# Patient Record
Sex: Female | Born: 1960 | Race: White | Hispanic: No | Marital: Married | Smoking: Current every day smoker
Health system: Southern US, Community
[De-identification: ages and names within clinical notes are randomized; demographics above are authoritative.]

---

## 2016-01-23 DIAGNOSIS — Y990 Civilian activity done for income or pay: Secondary | ICD-10-CM | POA: Diagnosis not present

## 2016-01-23 DIAGNOSIS — F17200 Nicotine dependence, unspecified, uncomplicated: Secondary | ICD-10-CM | POA: Insufficient documentation

## 2016-01-23 DIAGNOSIS — S63287A Dislocation of proximal interphalangeal joint of left little finger, initial encounter: Secondary | ICD-10-CM | POA: Diagnosis not present

## 2016-01-23 DIAGNOSIS — Y929 Unspecified place or not applicable: Secondary | ICD-10-CM | POA: Insufficient documentation

## 2016-01-23 DIAGNOSIS — W010XXA Fall on same level from slipping, tripping and stumbling without subsequent striking against object, initial encounter: Secondary | ICD-10-CM | POA: Insufficient documentation

## 2016-01-23 DIAGNOSIS — S6992XA Unspecified injury of left wrist, hand and finger(s), initial encounter: Secondary | ICD-10-CM | POA: Diagnosis present

## 2016-01-23 DIAGNOSIS — Y939 Activity, unspecified: Secondary | ICD-10-CM | POA: Diagnosis not present

## 2016-01-23 DIAGNOSIS — S62661A Nondisplaced fracture of distal phalanx of left index finger, initial encounter for closed fracture: Secondary | ICD-10-CM | POA: Insufficient documentation

## 2016-01-24 ENCOUNTER — Encounter (HOSPITAL_COMMUNITY): Payer: Self-pay | Admitting: Emergency Medicine

## 2016-01-24 ENCOUNTER — Emergency Department (HOSPITAL_COMMUNITY): Payer: No Typology Code available for payment source

## 2016-01-24 ENCOUNTER — Emergency Department (HOSPITAL_COMMUNITY)
Admission: EM | Admit: 2016-01-24 | Discharge: 2016-01-24 | Disposition: A | Discharge status: Home / Self Care | Payer: No Typology Code available for payment source | Attending: Emergency Medicine | Admitting: Emergency Medicine

## 2016-01-24 DIAGNOSIS — S63259A Unspecified dislocation of unspecified finger, initial encounter: Secondary | ICD-10-CM

## 2016-01-24 DIAGNOSIS — S62609A Fracture of unspecified phalanx of unspecified finger, initial encounter for closed fracture: Secondary | ICD-10-CM

## 2016-01-24 MED ORDER — BUPIVACAINE HCL 0.5 % IJ SOLN
50.0000 mL | Freq: Once | INTRAMUSCULAR | Status: DC
  Filled 2016-01-24: qty 50

## 2016-01-24 MED ORDER — HYDROCODONE-ACETAMINOPHEN 5-325 MG PO TABS
1.0000 | ORAL_TABLET | Freq: Once | ORAL | Status: AC
  Administered 2016-01-24: 1 via ORAL
  Filled 2016-01-24: qty 1

## 2016-01-24 MED ORDER — HYDROCODONE-ACETAMINOPHEN 5-325 MG PO TABS: 1.0000 | ORAL_TABLET | Freq: Four times a day (QID) | ORAL | 0 refills | Status: AC | PRN

## 2016-01-24 NOTE — ED Provider Notes (Signed)
[facility]-EMERGENCY DEPT Provider Note   CSN: [id number] Arrival date & time: 01/23/16  2353     History   Chief Complaint Chief Complaint  Patient presents with  . Fall    [employer]  . Hand Injury    HPI Barbara Carr is a 55 y.o. female.  The history is provided by the patient. No language interpreter was used.  Fall  This is a new problem. The current episode started less than 1 hour ago.  Hand Injury   The incident occurred less than 1 hour ago. The incident occurred at work. The injury mechanism was a fall. The pain is present in the left fingers. The quality of the pain is described as throbbing. The pain is moderate. The pain has been constant since the incident. She reports no foreign bodies present. The symptoms are aggravated by palpation and movement.    History reviewed. No pertinent past medical history.  There are no active problems to display for this patient.   History reviewed. No pertinent surgical history.  OB History    No data available       Home Medications    Prior to Admission medications   Not on File    Family History No family history on file.  Social History Social History  Substance Use Topics  . Smoking status: Current Every Day Smoker  . Smokeless tobacco: Never Used  . Alcohol use No     Allergies   Novocain [procaine]   Review of Systems Review of Systems  Musculoskeletal: Positive for arthralgias.  All other systems reviewed and are negative.    Physical Exam Updated Vital Signs BP 106/69 (BP Location: Left Arm)   Pulse 77   Temp 97.9 F (36.6 C) (Oral)   Resp 18   Ht 5' (1.524 m)   Wt 102.1 kg   SpO2 99%   BMI 43.94 kg/m   Physical Exam  Constitutional: She is oriented to person, place, and time. She appears well-developed and well-nourished.  HENT:  Head: Normocephalic.  Eyes: EOM are normal.  Neck: Neck supple.  Cardiovascular: Normal rate and regular rhythm.   Pulmonary/Chest: Effort  normal and breath sounds normal.  Abdominal: Soft. Bowel sounds are normal.  Musculoskeletal: She exhibits edema, tenderness and deformity.       Left hand: She exhibits decreased range of motion, tenderness, deformity and swelling.       Hands: Neurological: She is alert and oriented to person, place, and time.  Skin: Skin is warm and dry. Capillary refill takes less than 2 seconds.  Psychiatric: She has a normal mood and affect.  Nursing note and vitals reviewed.    ED Treatments / Results  Labs (all labs ordered are listed, but only abnormal results are displayed) Labs Reviewed - No data to display  EKG  EKG Interpretation None       Radiology Dg Hand Complete Left  Result Date: 01/24/2016 CLINICAL DATA:  55 year old female with fall and pain over the fourth and fifth digits. EXAM: LEFT HAND - COMPLETE 3+ VIEW COMPARISON:  None FINDINGS: There is a nondisplaced fracture of the base and proximal aspect of the proximal phalanx of the fourth digit. There is no extension of the fracture into the ulnar corner of the articular surface of the base of the phalanx. There is no widening of the joint space for dislocation at the fourth MCP joint. No other fracture identified. There is dorsal dislocation of the fifth digit at the PIP  joint. No associated fracture identified at this site. The bones are mildly osteopenic. There is soft tissue swelling of the proximal aspect of the third and fourth digits. No radiopaque foreign object. IMPRESSION: Nondisplaced fracture of the base and proximal portion of the proximal phalanx of the fourth digit. Dislocation of the fifth digit at the PIP without associated fracture. Electronically Signed   By: Elgie CollardArash  Radparvar M.D.   On: 01/24/2016 01:29   Dg Finger Little Left  Result Date: 01/24/2016 CLINICAL DATA:  55 y/o  F; post reduction radiographs. EXAM: LEFT LITTLE FINGER 2+V COMPARISON:  None. FINDINGS: Interval reduction of the fifth proximal  interphalangeal joint with anatomic alignment. Unchanged nondisplaced fracture through base of fourth proximal phalanx. No new fracture or dislocation identified. IMPRESSION: Interval reduction of the fifth proximal interphalangeal joint with anatomic alignment. Unchanged nondisplaced fracture through base of fourth proximal phalanx. No new fracture or dislocation identified. Electronically Signed   By: Mitzi HansenLance  Furusawa-Stratton M.D.   On: 01/24/2016 02:05    Procedures Procedures (including critical care time)  Medications Ordered in ED Medications  bupivacaine (MARCAINE) 0.5 % (with pres) injection 50 mL (not administered)     Initial Impression / Assessment and Plan / ED Course  I have reviewed the triage vital signs and the nursing notes.  Pertinent labs & imaging results that were available during my care of the patient were reviewed by me and considered in my medical decision making (see chart for details).  Clinical Course   Patient X-Ray positive for left 5th digit dislocation and proximal left 4th digit phalanx fracture. Dislocation reduced in ED.  Pt advised to follow up with orthopedics [place] Patient given splint while in ED, conservative therapy recommended and discussed. Patient will be discharged home & is agreeable with above plan. Returns precautions discussed. Pt appears safe for discharge. Workman's comp UDS obtained prior to narcotic administration.    Final Clinical Impressions(s) / ED Diagnoses   Final diagnoses:  Finger dislocation, initial encounter  Closed fracture of phalanx of digit of hand, initial encounter    New Prescriptions New Prescriptions   HYDROCODONE-ACETAMINOPHEN (NORCO/VICODIN) 5-325 MG TABLET    Take 1 tablet by mouth every 6 (six) hours as needed for severe pain.     Felicie Mornavid Luismanuel Corman, NP 01/24/16 [id number]    Arby BarretteMarcy Pfeiffer, MD 01/24/16 [id number]

## 2016-01-24 NOTE — ED Notes (Signed)
Ortho tech called for splint.

## 2016-01-24 NOTE — ED Notes (Signed)
Pt to xray at this time.

## 2016-01-24 NOTE — ED Triage Notes (Signed)
Patient tripped and fell at work this evening , presents with left hand pain /swelling and left 5th finger deformity , she denies LOC/ambulatory .

## 2016-01-24 NOTE — Progress Notes (Signed)
Orthopedic Tech Progress Note Patient Details:  Barbara GentryLinda Carr 08/25/1960 [id number]  Ortho Devices Type of Ortho Device: Buddy tape, Finger splint Ortho Device/Splint Location: lue 4th and 5th finger splint and buddy tape Ortho Device/Splint Interventions: Ordered, Application Applied splint to 4th finger and 5th finger of lue as per drs verbal order.  Trinna PostMartinez, Rafel Garde J 01/24/2016, 2:29 AM

## 2017-05-29 IMAGING — CR DG HAND COMPLETE 3+V*L*
4 series · 4 of 4 positions shown · non-contrast
Comparison: None

CLINICAL DATA: 55-year-old female with fall and pain over the
fourth and fifth digits.

EXAM:
LEFT HAND - COMPLETE 3+ VIEW

[hand pa]
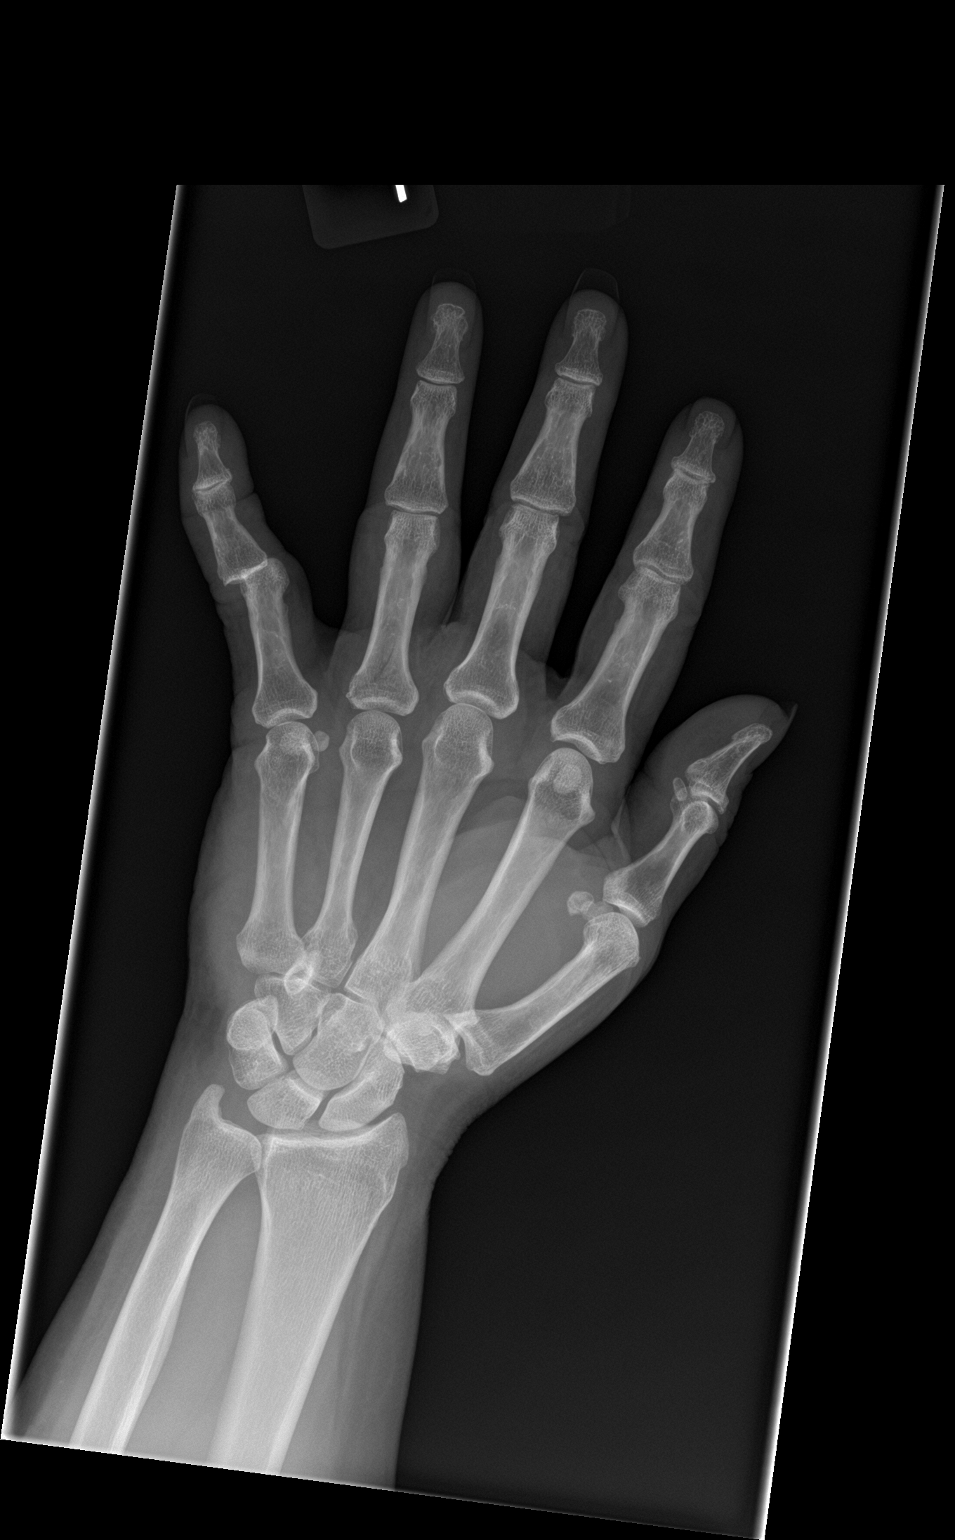

[hand obl]
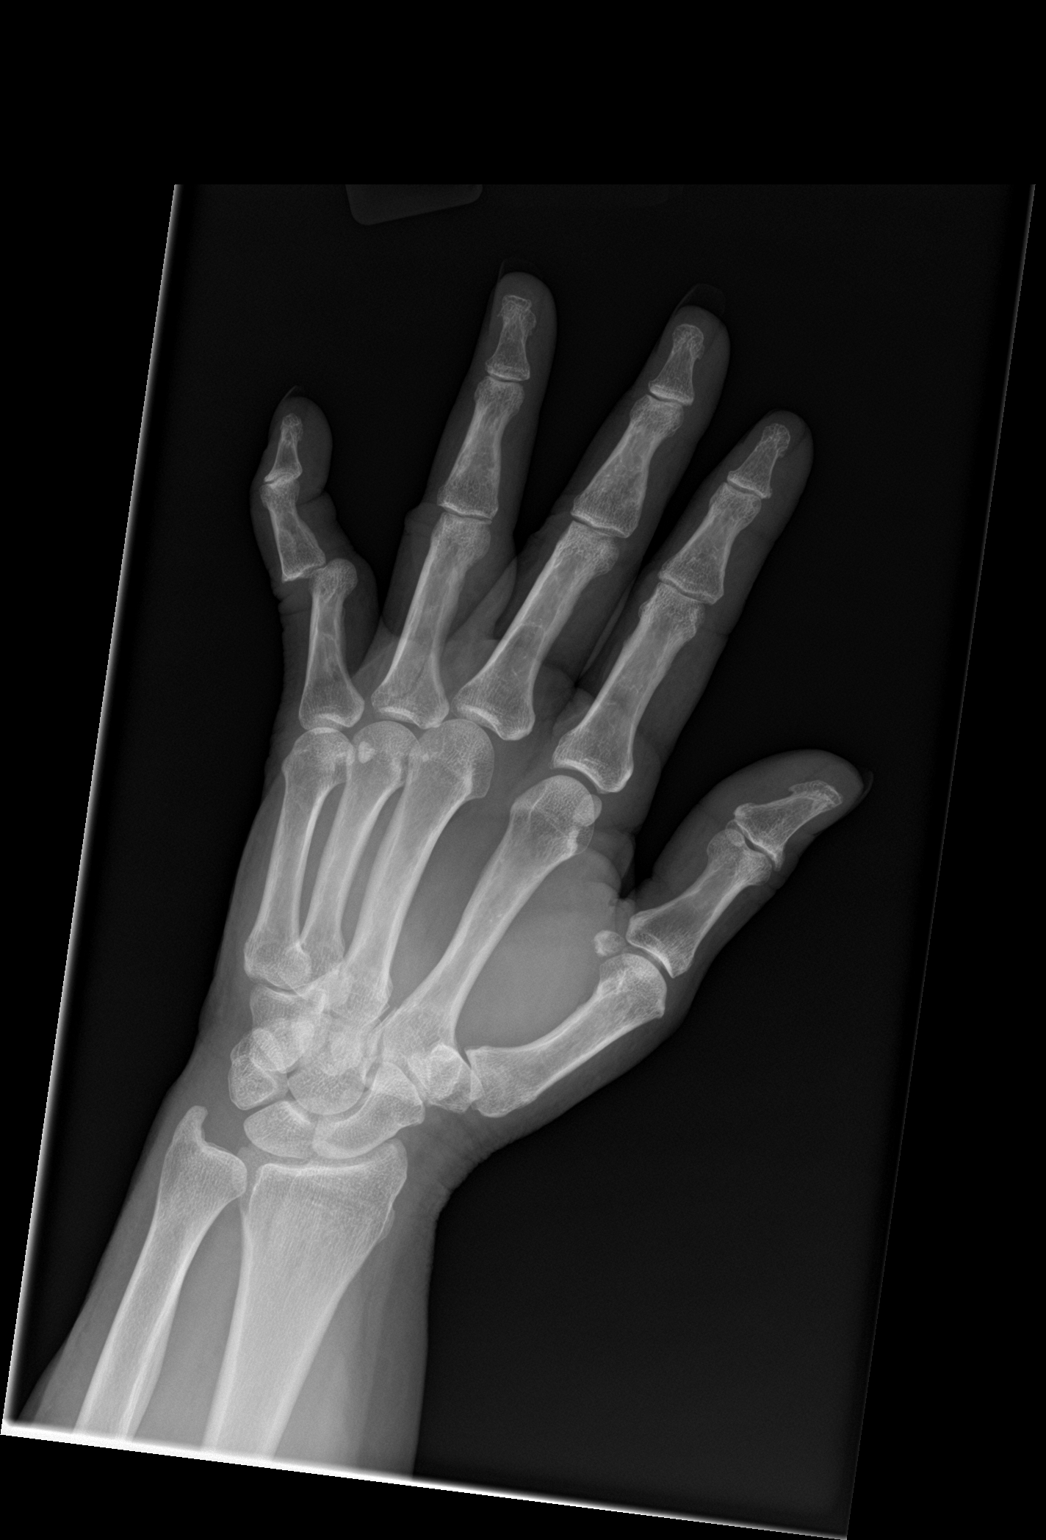

[hand lat (1 of 2)]
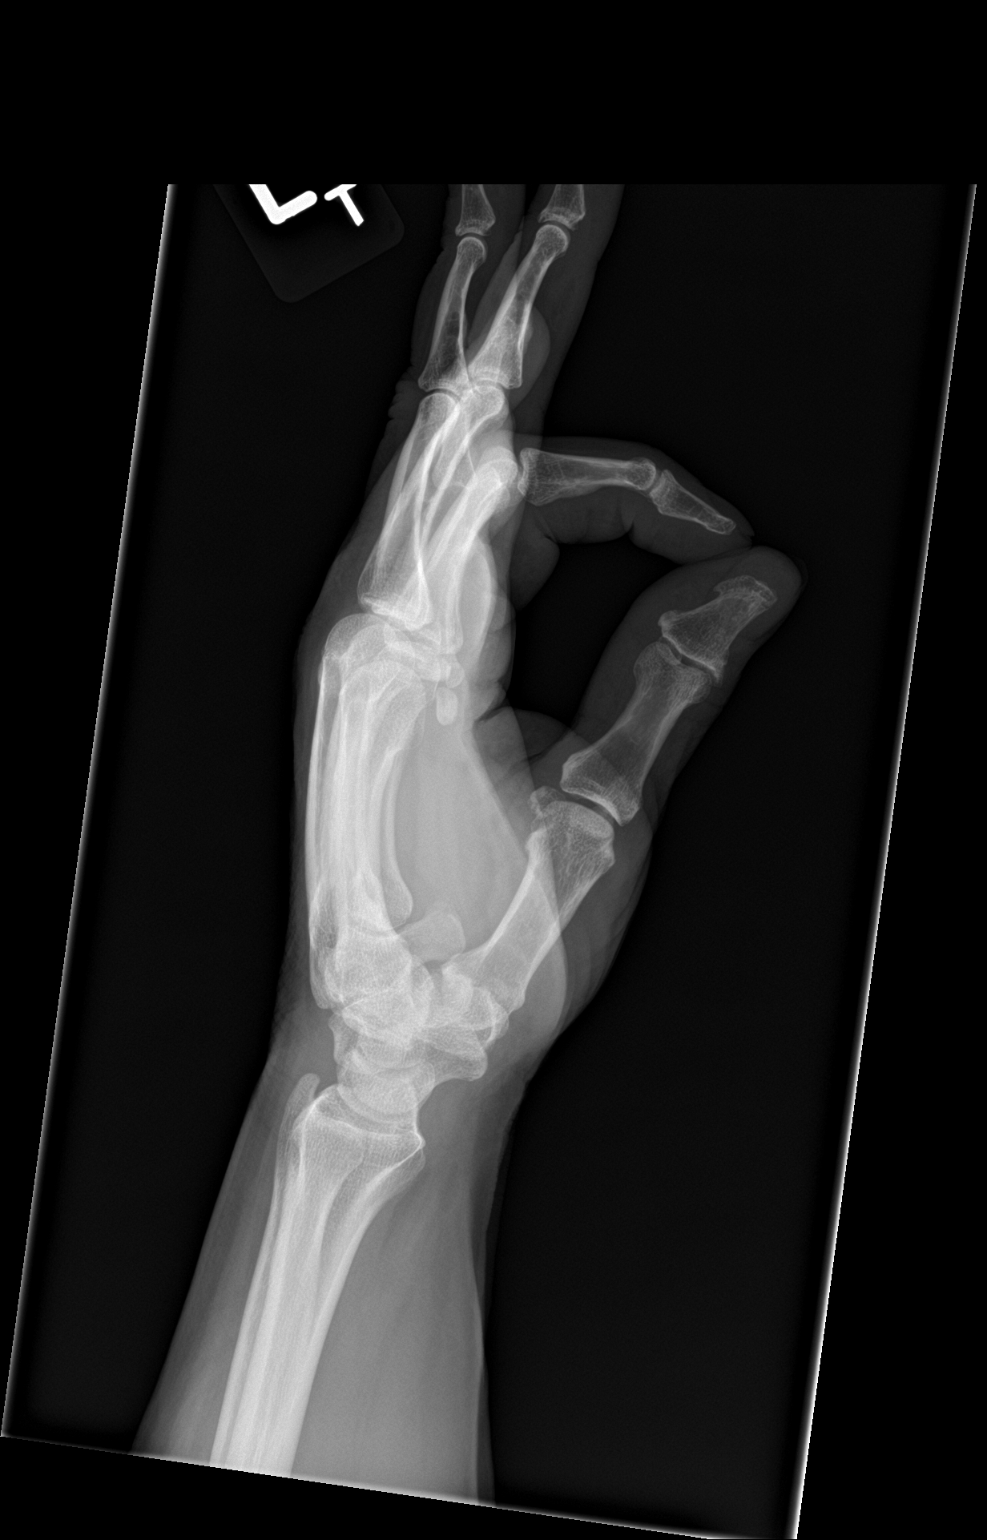

[hand lat (2 of 2)]
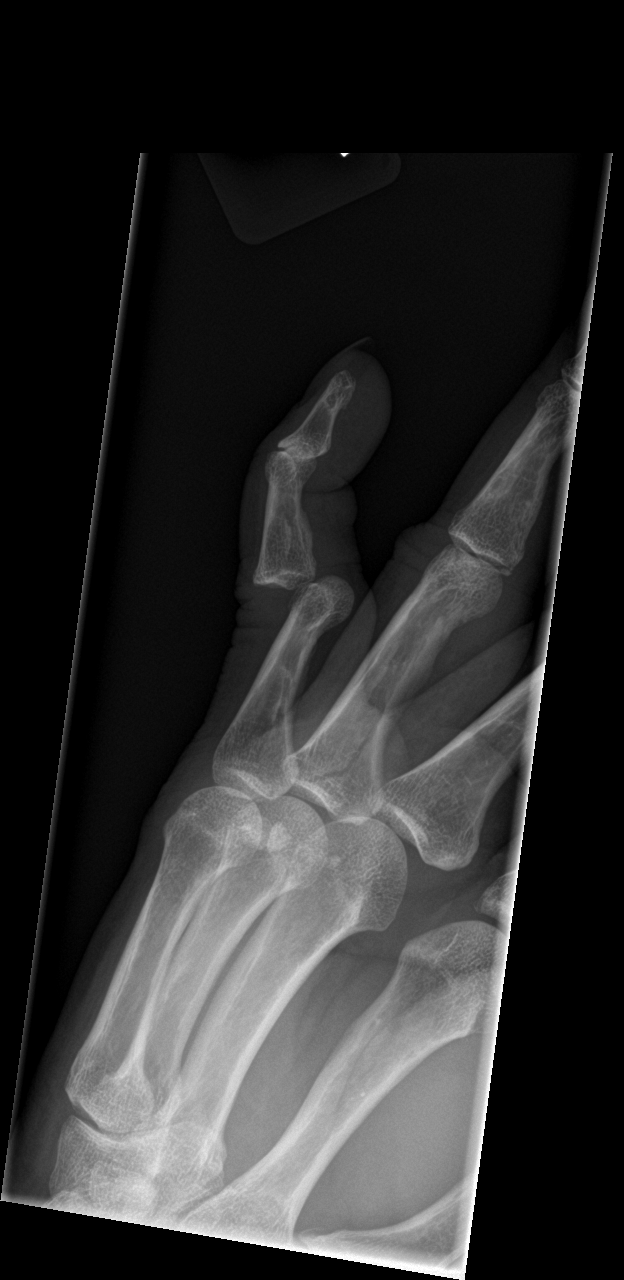

[4 of 4 positions shown; findings below may reference images not displayed]

FINDINGS: There is a nondisplaced fracture of the base and proximal aspect of
the proximal phalanx of the fourth digit. There is no extension of
the fracture into the ulnar corner of the articular surface of the
base of the phalanx. There is no widening of the joint space for
dislocation at the fourth MCP joint. No other fracture identified.

There is dorsal dislocation of the fifth digit at the PIP joint. No
associated fracture identified at this site. The bones are mildly
osteopenic. There is soft tissue swelling of the proximal aspect of
the third and fourth digits. No radiopaque foreign object.
IMPRESSION: Nondisplaced fracture of the base and proximal portion of the
proximal phalanx of the fourth digit.

Dislocation of the fifth digit at the PIP without associated
fracture.

## 2017-05-29 IMAGING — CR DG FINGER LITTLE 2+V*L*
3 series · 3 of 3 positions shown · non-contrast
Comparison: None.

CLINICAL DATA: 55 y/o  F; post reduction radiographs.

EXAM:
LEFT LITTLE FINGER 2+V

[finger ap]
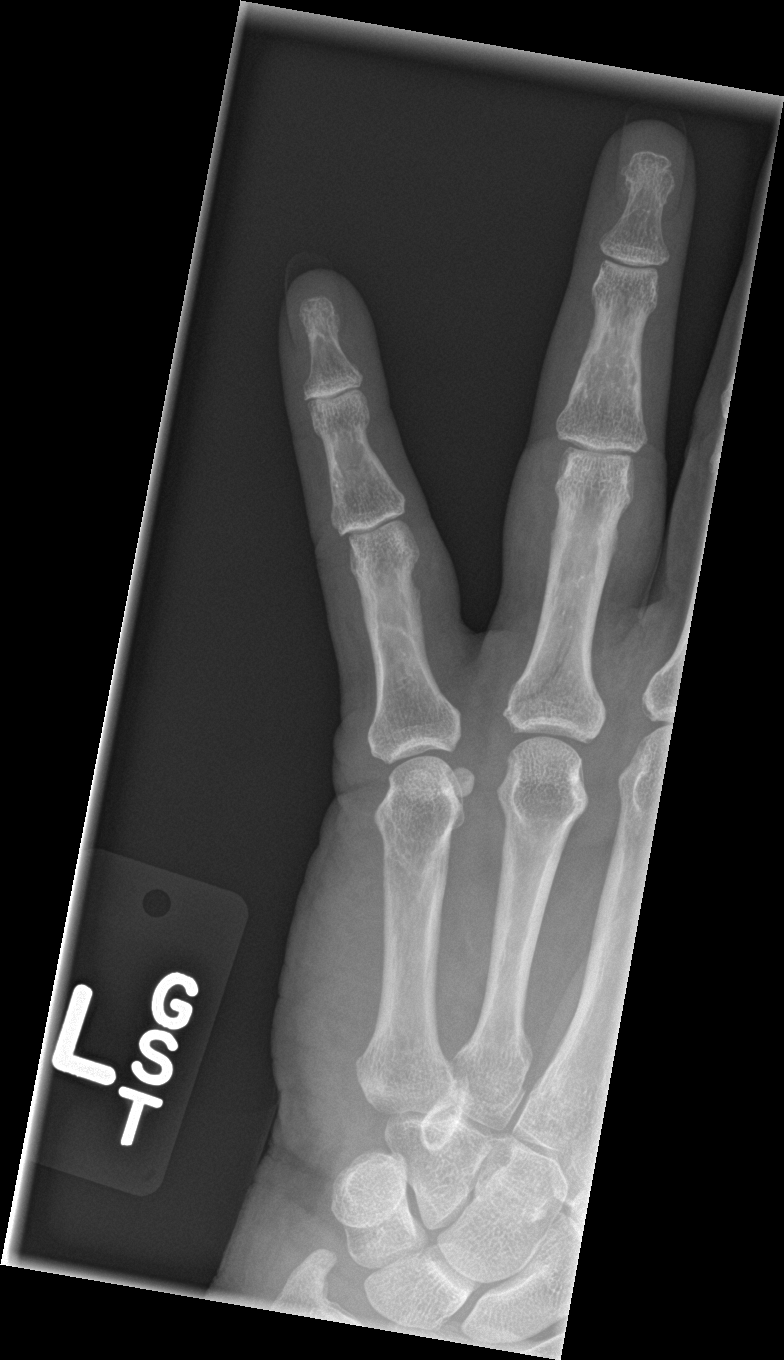

[finger obl]
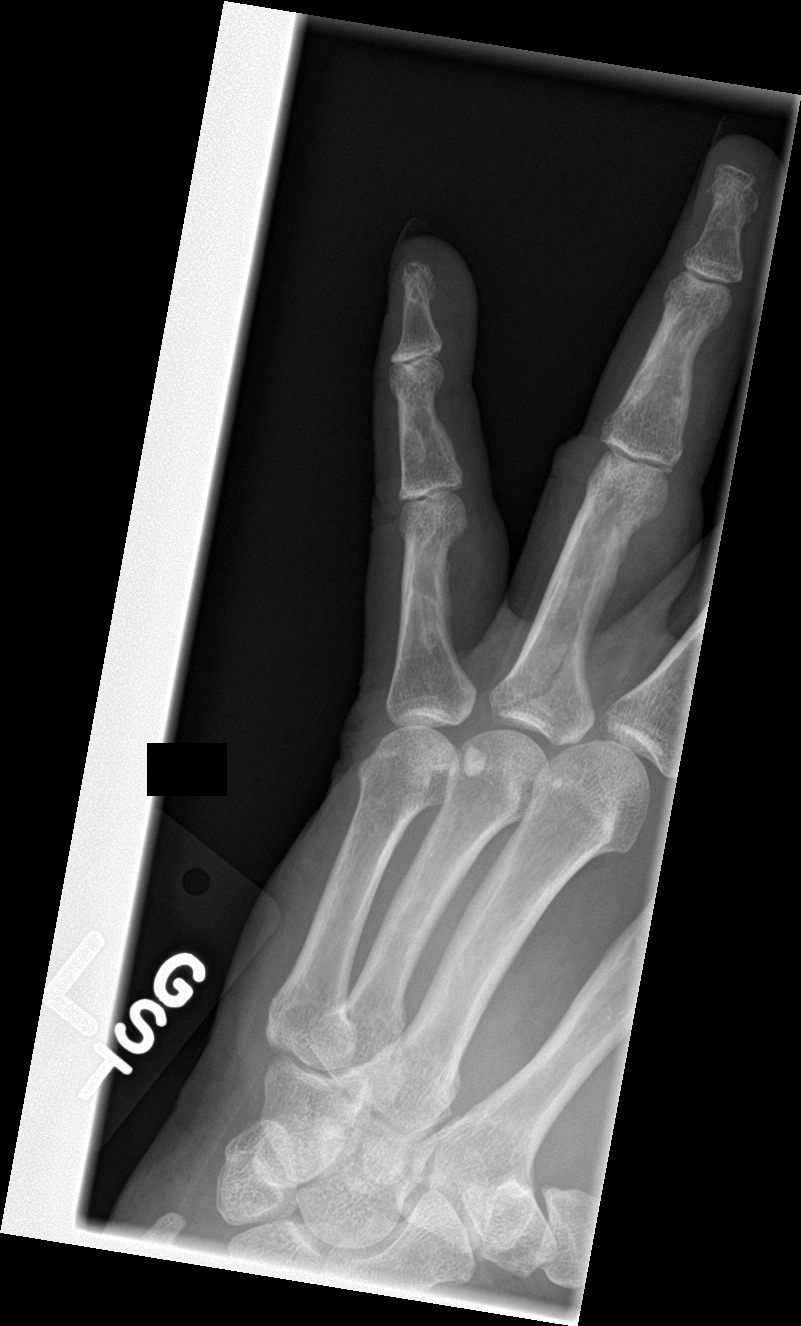

[finger lat]
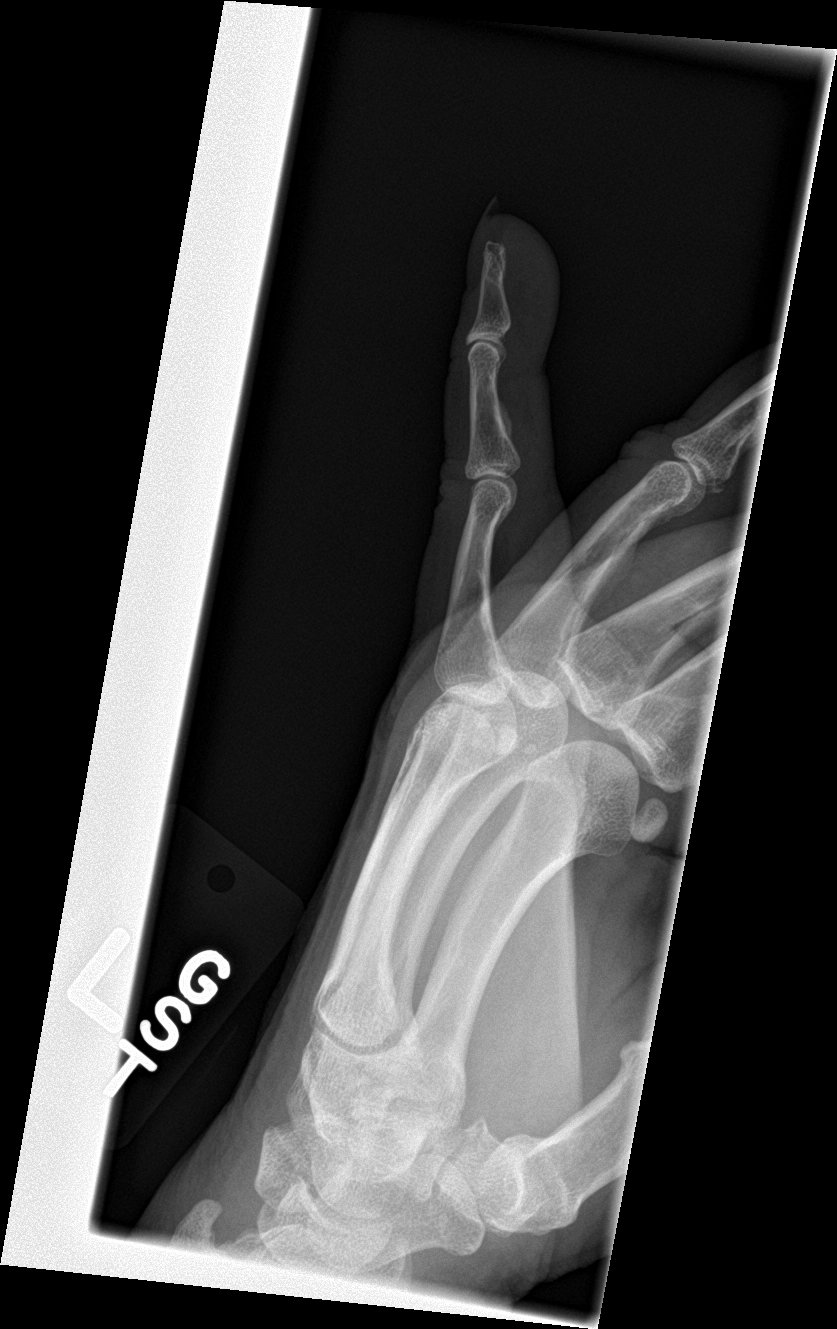

[3 of 3 positions shown; findings below may reference images not displayed]

FINDINGS: Interval reduction of the fifth proximal interphalangeal joint with
anatomic alignment. Unchanged nondisplaced fracture through base of
fourth proximal phalanx. No new fracture or dislocation identified.
IMPRESSION: Interval reduction of the fifth proximal interphalangeal joint with
anatomic alignment. Unchanged nondisplaced fracture through base of
fourth proximal phalanx. No new fracture or dislocation identified.

By: Oluwabukunolami Nwabudike M.D.
